# Patient Record
Sex: Female | Born: 2000 | Race: Black or African American | Hispanic: No | Marital: Single | Smoking: Never smoker
Health system: Southern US, Community
[De-identification: ages and names within clinical notes are randomized; demographics above are authoritative.]

## PROBLEM LIST (undated history)

## (undated) DIAGNOSIS — H6690 Otitis media, unspecified, unspecified ear: Secondary | ICD-10-CM

## (undated) DIAGNOSIS — F329 Major depressive disorder, single episode, unspecified: Secondary | ICD-10-CM

## (undated) DIAGNOSIS — F32A Depression, unspecified: Secondary | ICD-10-CM

## (undated) HISTORY — PX: ADENOIDECTOMY: SUR15

## (undated) HISTORY — PX: TONSILLECTOMY: SUR1361

## (undated) HISTORY — PX: TYMPANOSTOMY TUBE PLACEMENT: SHX32

---
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
## 2019-05-23 IMAGING — DX DG WRIST COMPLETE 3+V*L*
4 series · 4 of 4 positions shown · non-contrast
Comparison: None.

CLINICAL DATA: Acute left wrist pain without known injury.

EXAM:
LEFT WRIST - COMPLETE 3+ VIEW

[x wrist pa left]
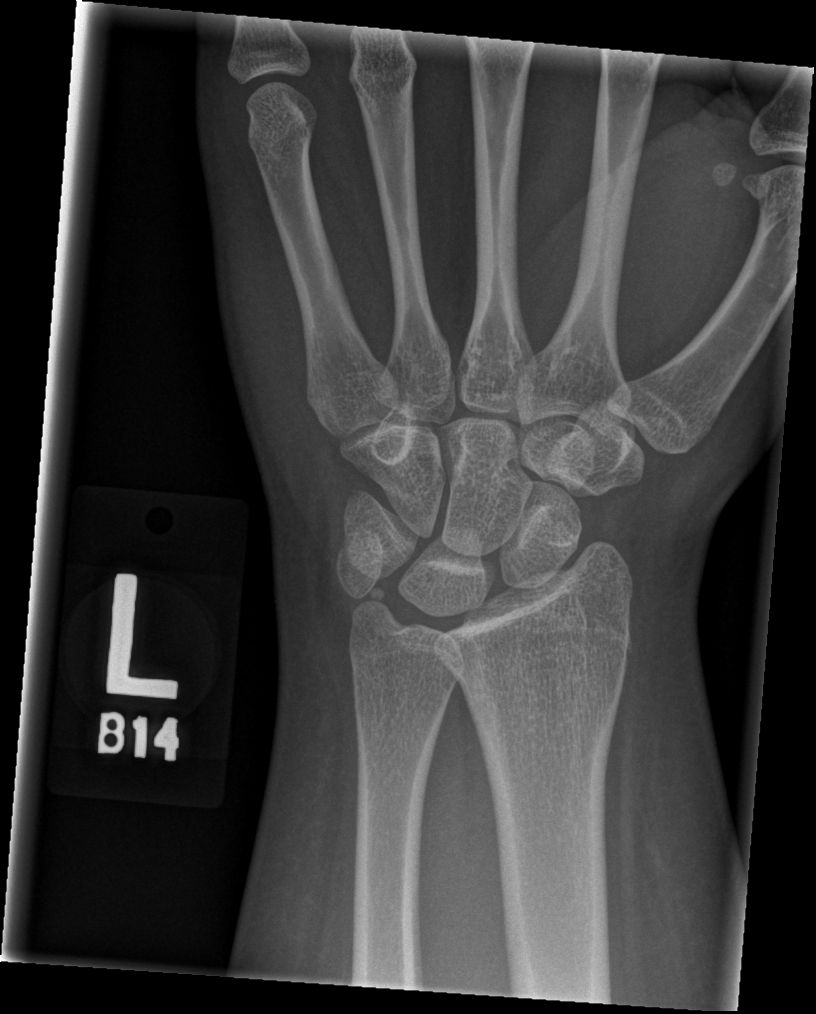

[x wrist obl left]
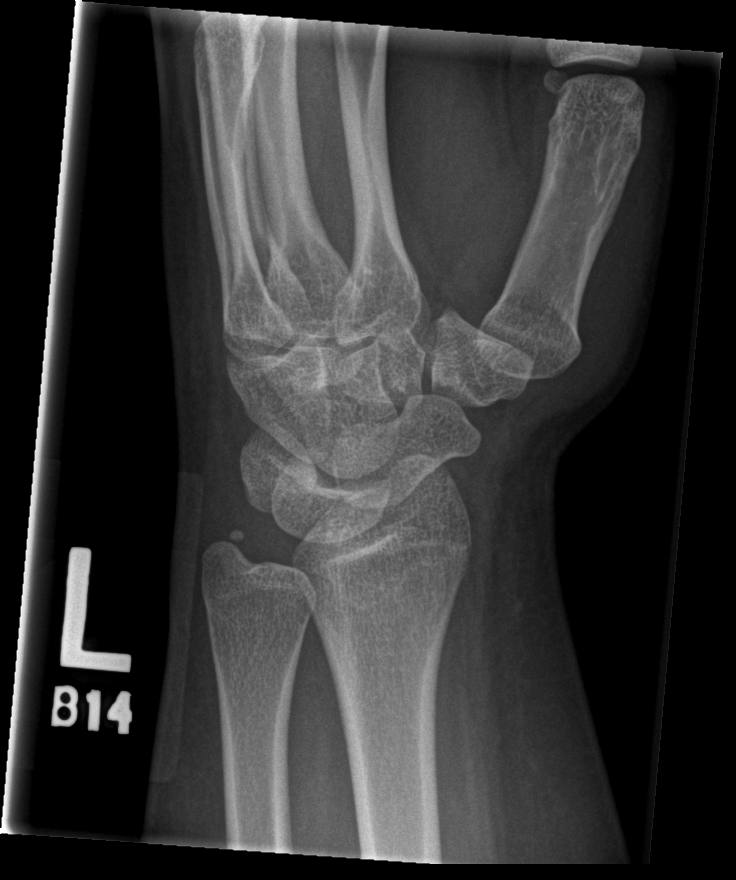

[x wrist lat left]
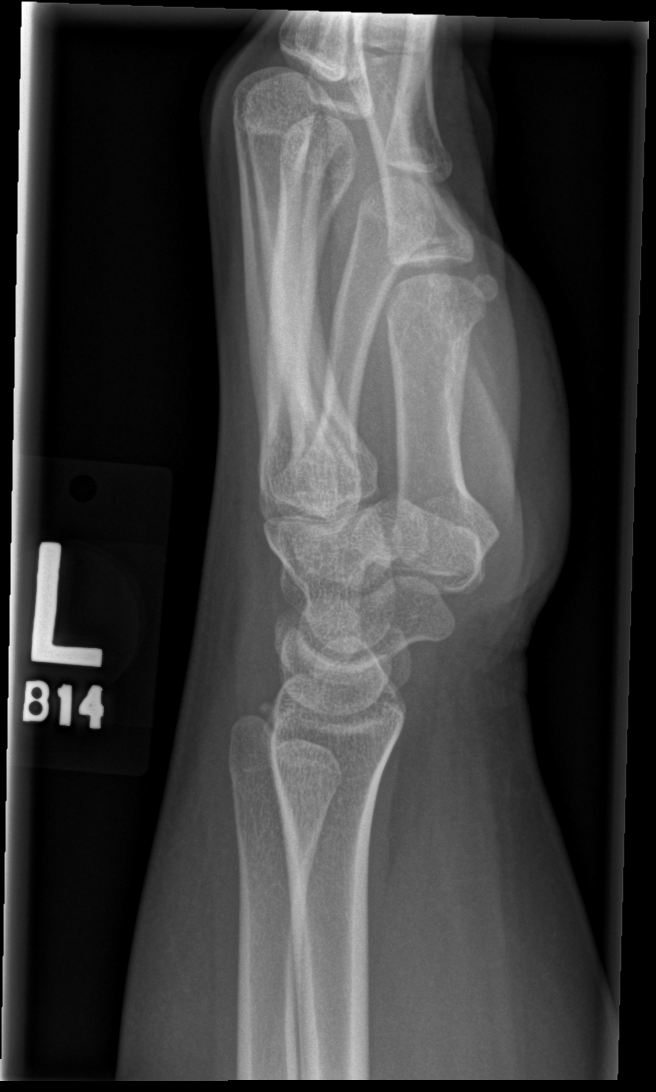

[x wrist navicular view left]
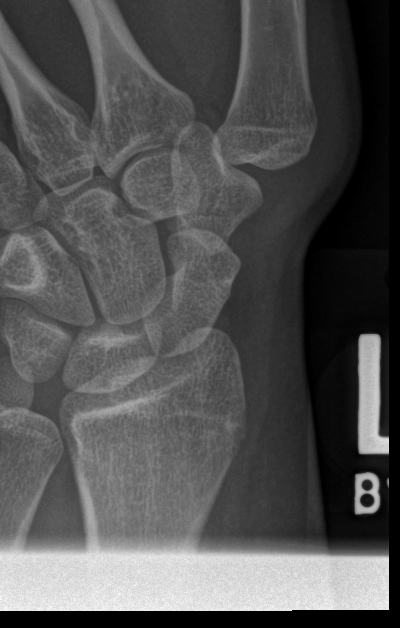

[4 of 4 positions shown; findings below may reference images not displayed]

FINDINGS: There is no evidence of fracture or dislocation. There is no
evidence of arthropathy or other focal bone abnormality. Soft
tissues are unremarkable.
IMPRESSION: Normal left wrist.

## 2020-02-10 IMAGING — US US ABDOMEN COMPLETE
1 series · 14 of 25 positions shown · non-contrast
Comparison: None.

CLINICAL DATA: Epigastric pain with vomiting

EXAM:
ABDOMEN ULTRASOUND COMPLETE

[Series 1: us abdomen complete · 0.19mm/px · 14 of 102 slices shown]
[im 1/102]
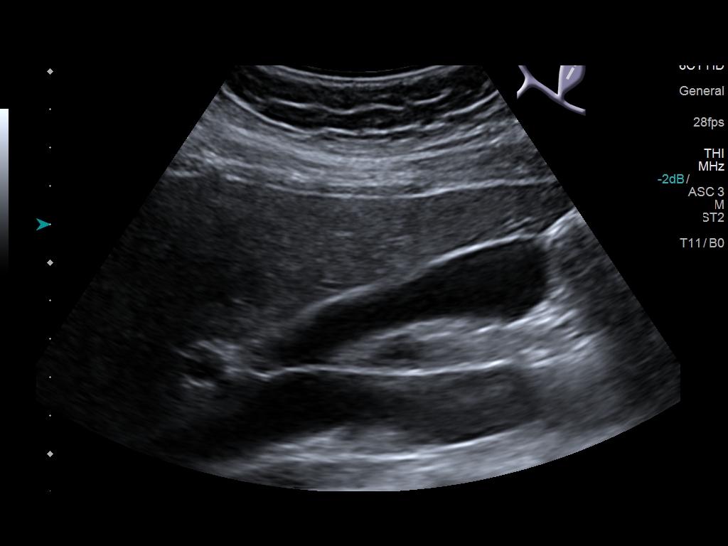
[im 9/102]
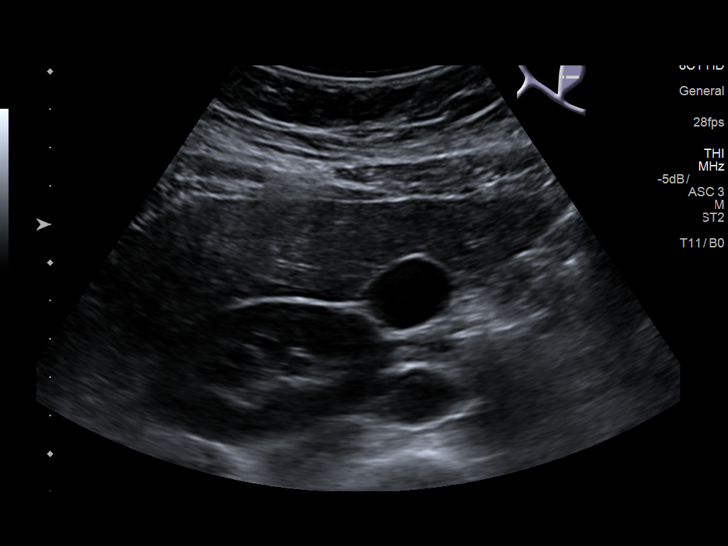
[im 17/102]
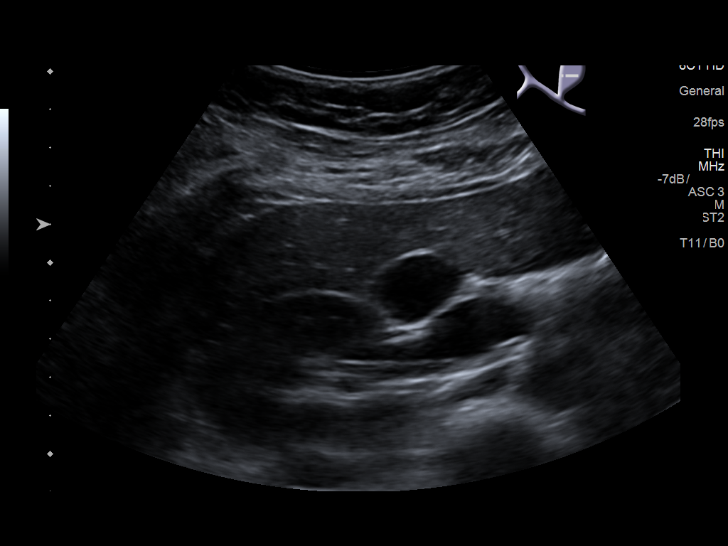
[im 26/102]
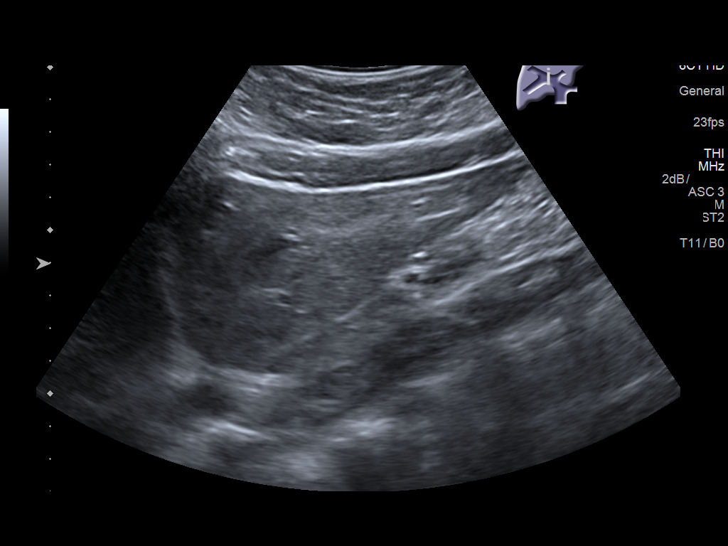
[im 34/102]
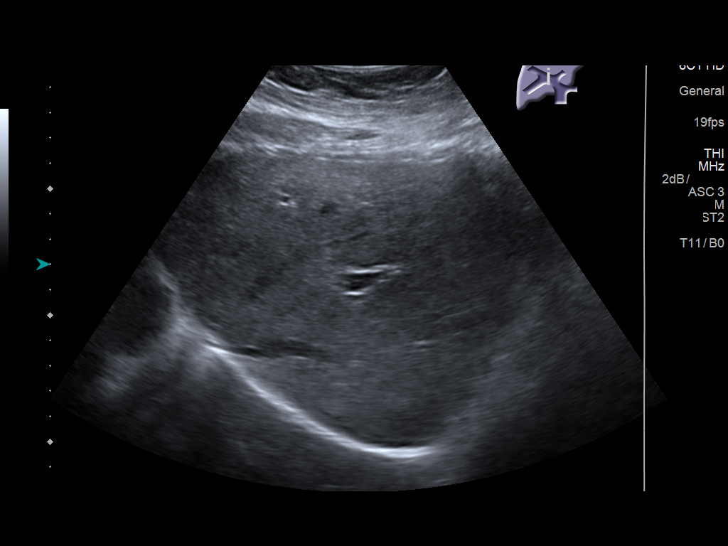
[im 38/102]
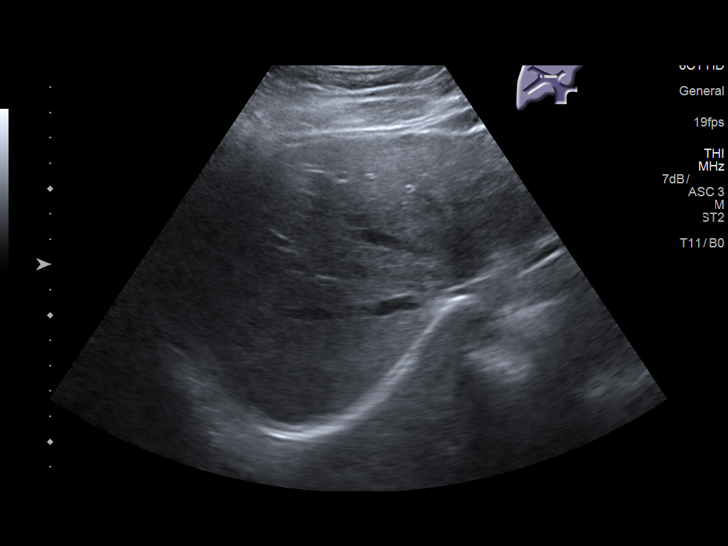
[im 47/102]
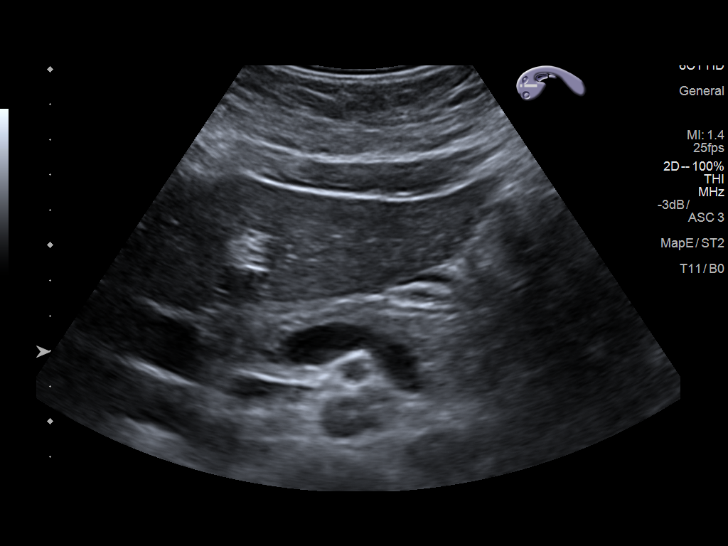
[im 55/102]
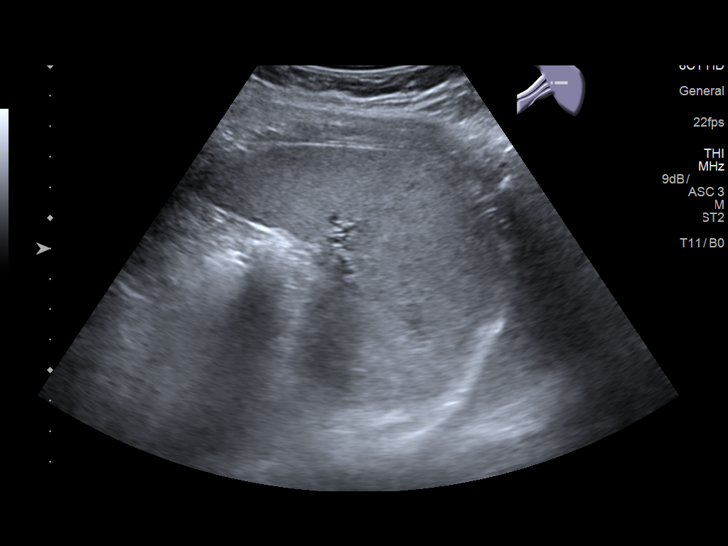
[im 64/102]
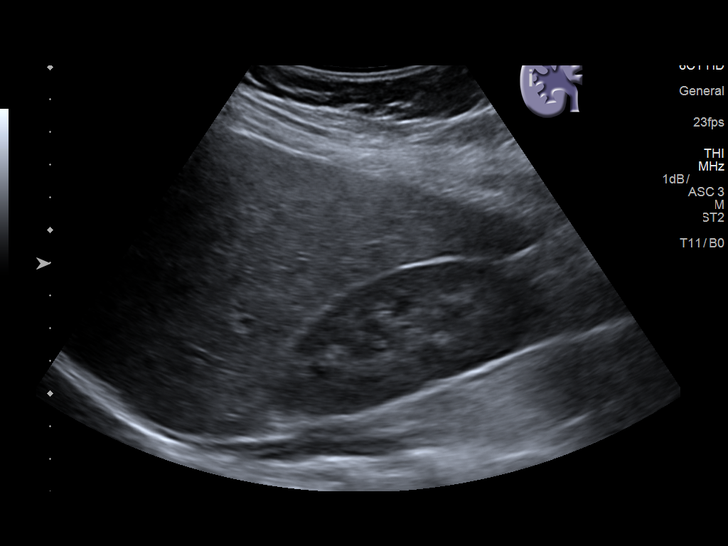
[im 68/102]
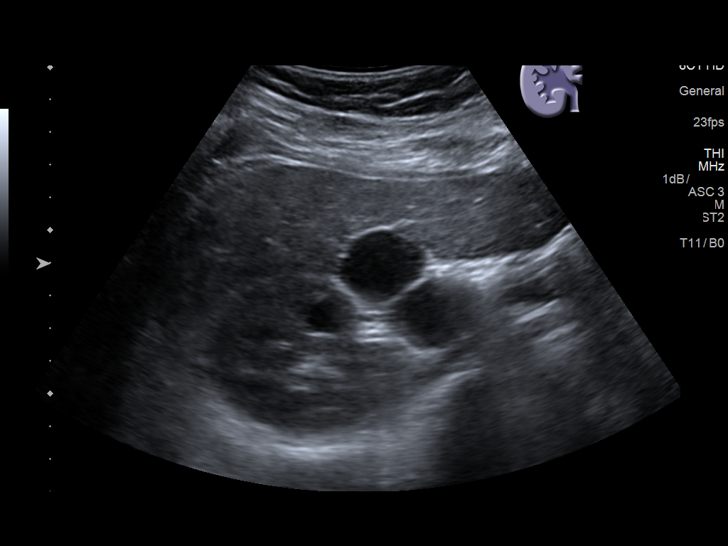
[im 76/102]
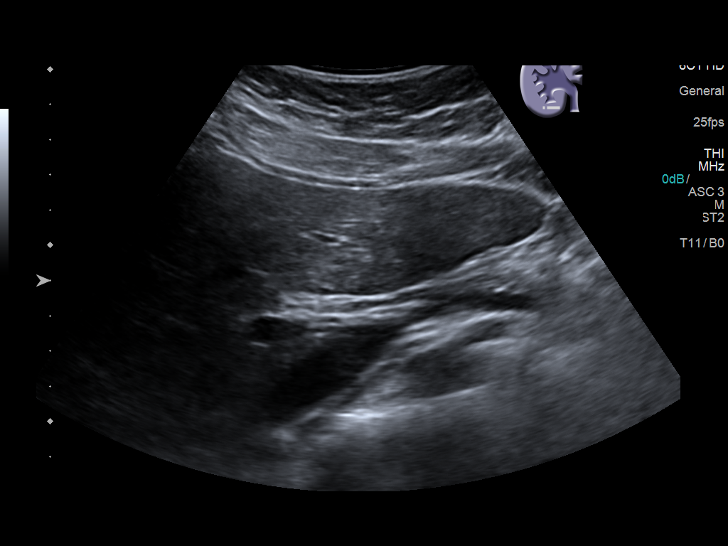
[im 85/102]
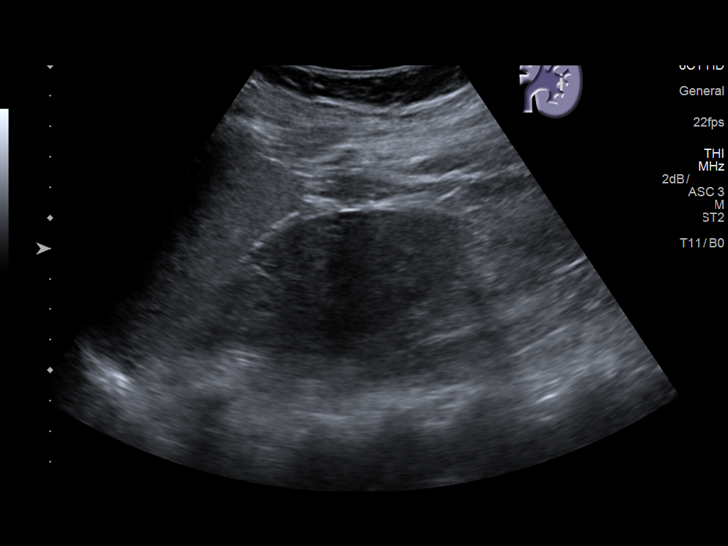
[im 93/102]
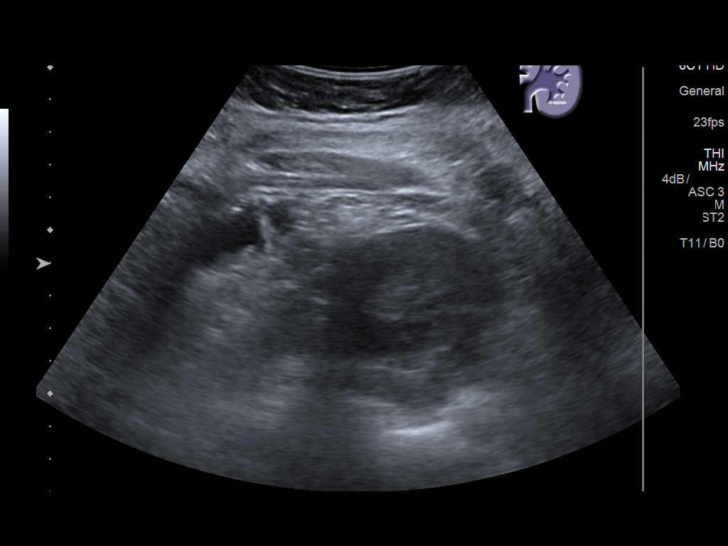
[im 102/102]
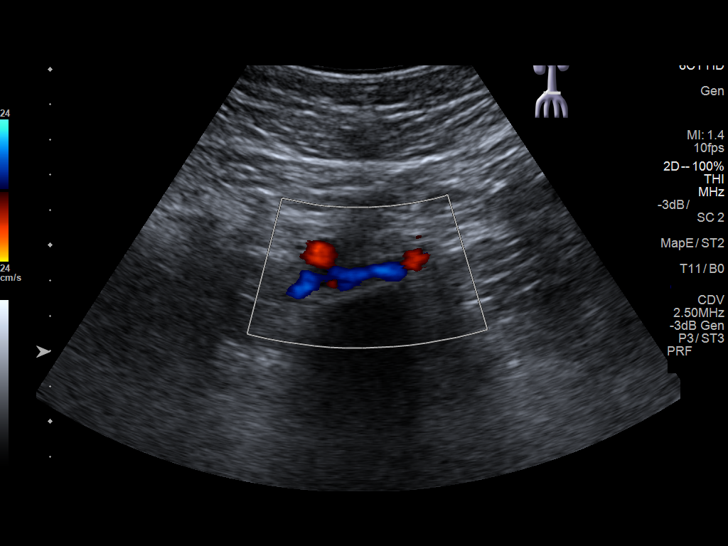

[14 of 25 positions shown; findings below may reference images not displayed]

FINDINGS: Gallbladder: No gallstones or wall thickening visualized. No
sonographic Murphy sign noted by sonographer.

Common bile duct: Diameter: 3.2 mm

Liver: No focal lesion identified. Within normal limits in
parenchymal echogenicity. Portal vein is patent on color Doppler
imaging with normal direction of blood flow towards the liver.

IVC: No abnormality visualized.

Pancreas: Visualized portion unremarkable.

Spleen: Size and appearance within normal limits.

Right Kidney: Length: 9.5 cm. Echogenicity within normal limits. No
hydronephrosis. Small cyst in the mid to upper pole measuring 1.2 x
1.1 x 1.4 cm.

Left Kidney: Length: 11 cm. Echogenicity within normal limits. No
mass or hydronephrosis visualized.

Abdominal aorta: No aneurysm visualized.

Other findings: None.
IMPRESSION: 1. Negative for gallstones or biliary dilatation
2. Small cyst in the right kidney
